# Patient Record
Sex: Male | Born: 1960 | Race: White | Hispanic: No | Marital: Married | Smoking: Never smoker
Health system: Southern US, Community
[De-identification: ages and names within clinical notes are randomized; demographics above are authoritative.]

## PROBLEM LIST (undated history)

## (undated) DIAGNOSIS — E119 Type 2 diabetes mellitus without complications: Secondary | ICD-10-CM

## (undated) DIAGNOSIS — E7800 Pure hypercholesterolemia, unspecified: Secondary | ICD-10-CM

## (undated) HISTORY — PX: APPENDECTOMY: SHX54

## (undated) HISTORY — PX: HERNIA REPAIR: SHX51

---

## 2005-11-29 ENCOUNTER — Observation Stay (HOSPITAL_COMMUNITY): Admission: EM | Admit: 2005-11-29 | Discharge: 2005-11-29 | Payer: Self-pay | Admitting: Emergency Medicine

## 2006-01-07 ENCOUNTER — Ambulatory Visit: Payer: Self-pay | Admitting: Family Medicine

## 2006-01-24 ENCOUNTER — Ambulatory Visit: Payer: Self-pay | Admitting: Family Medicine

## 2006-03-08 ENCOUNTER — Ambulatory Visit: Payer: Self-pay | Admitting: Family Medicine

## 2015-11-23 ENCOUNTER — Encounter (HOSPITAL_COMMUNITY): Payer: Self-pay | Admitting: Emergency Medicine

## 2015-11-23 ENCOUNTER — Emergency Department (HOSPITAL_COMMUNITY)
Admission: EM | Admit: 2015-11-23 | Discharge: 2015-11-23 | Disposition: A | Discharge status: Home / Self Care | Payer: BLUE CROSS/BLUE SHIELD | Attending: Emergency Medicine | Admitting: Emergency Medicine

## 2015-11-23 ENCOUNTER — Emergency Department (HOSPITAL_COMMUNITY): Payer: BLUE CROSS/BLUE SHIELD

## 2015-11-23 DIAGNOSIS — Z794 Long term (current) use of insulin: Secondary | ICD-10-CM | POA: Insufficient documentation

## 2015-11-23 DIAGNOSIS — R072 Precordial pain: Secondary | ICD-10-CM | POA: Insufficient documentation

## 2015-11-23 DIAGNOSIS — E119 Type 2 diabetes mellitus without complications: Secondary | ICD-10-CM | POA: Diagnosis not present

## 2015-11-23 DIAGNOSIS — Z79899 Other long term (current) drug therapy: Secondary | ICD-10-CM | POA: Insufficient documentation

## 2015-11-23 DIAGNOSIS — R079 Chest pain, unspecified: Secondary | ICD-10-CM

## 2015-11-23 HISTORY — DX: Type 2 diabetes mellitus without complications: E11.9

## 2015-11-23 HISTORY — DX: Pure hypercholesterolemia, unspecified: E78.00

## 2015-11-23 LAB — CBC WITH DIFFERENTIAL/PLATELET
BASOS PCT: 0 %
Basophils Absolute: 0 10*3/uL (ref 0.0–0.1)
Eosinophils Absolute: 0.1 10*3/uL (ref 0.0–0.7)
Eosinophils Relative: 2 %
HEMATOCRIT: 41.7 % (ref 39.0–52.0)
Hemoglobin: 14.6 g/dL (ref 13.0–17.0)
Lymphocytes Relative: 22 %
Lymphs Abs: 1.5 10*3/uL (ref 0.7–4.0)
MCH: 32.4 pg (ref 26.0–34.0)
MCHC: 35 g/dL (ref 30.0–36.0)
MCV: 92.5 fL (ref 78.0–100.0)
MONO ABS: 0.6 10*3/uL (ref 0.1–1.0)
MONOS PCT: 8 %
NEUTROS ABS: 4.5 10*3/uL (ref 1.7–7.7)
Neutrophils Relative %: 68 %
Platelets: 200 10*3/uL (ref 150–400)
RBC: 4.51 MIL/uL (ref 4.22–5.81)
RDW: 13 % (ref 11.5–15.5)
WBC: 6.7 10*3/uL (ref 4.0–10.5)

## 2015-11-23 LAB — I-STAT CHEM 8, ED
BUN: 9 mg/dL (ref 6–20)
CALCIUM ION: 1.19 mmol/L (ref 1.12–1.23)
CREATININE: 0.6 mg/dL — AB (ref 0.61–1.24)
Chloride: 101 mmol/L (ref 101–111)
GLUCOSE: 221 mg/dL — AB (ref 65–99)
HEMATOCRIT: 42 % (ref 39.0–52.0)
HEMOGLOBIN: 14.3 g/dL (ref 13.0–17.0)
Potassium: 4 mmol/L (ref 3.5–5.1)
Sodium: 138 mmol/L (ref 135–145)
TCO2: 26 mmol/L (ref 0–100)

## 2015-11-23 LAB — I-STAT TROPONIN, ED
TROPONIN I, POC: 0 ng/mL (ref 0.00–0.08)
Troponin i, poc: 0.01 ng/mL (ref 0.00–0.08)

## 2015-11-23 MED ORDER — LORAZEPAM 1 MG PO TABS
1.0000 mg | ORAL_TABLET | Freq: Once | ORAL | Status: AC
  Administered 2015-11-23: 1 mg via ORAL
  Filled 2015-11-23: qty 1

## 2015-11-23 MED ORDER — SODIUM CHLORIDE 0.9 % IV BOLUS (SEPSIS)
1000.0000 mL | Freq: Once | INTRAVENOUS | Status: AC
  Administered 2015-11-23: 1000 mL via INTRAVENOUS

## 2015-11-23 MED ORDER — ASPIRIN 81 MG PO CHEW
324.0000 mg | CHEWABLE_TABLET | Freq: Once | ORAL | Status: AC
  Administered 2015-11-23: 324 mg via ORAL
  Filled 2015-11-23: qty 4

## 2015-11-23 MED ORDER — GLUCOSE BLOOD VI STRP: ORAL_STRIP | Status: AC

## 2015-11-23 NOTE — ED Notes (Signed)
MD wants patient to receive second liter prior to DC.

## 2015-11-23 NOTE — Discharge Instructions (Signed)

## 2015-11-23 NOTE — ED Notes (Signed)
1 liter normal saline infused per pt request, after infusion, pt states he feels better.

## 2015-11-23 NOTE — ED Notes (Signed)
Pt to ER via private vehicle with complaint of sudden onset central chest pressure that woke patient this morning from sleep. Denies radiation. Reports shortness of breath, diaphoresis, and nausea with 1 episode of vomiting 30 cc. Pt reports hx of panic attacks. VSS at this time. A/o x4. Pt hx of DM type II and high cholesterol.

## 2015-11-23 NOTE — ED Notes (Signed)
Brought patient back via wheelchair; patient undressed, in gown, on monitor, continuous pulse oximetry and blood pressure cuff 

## 2015-11-23 NOTE — ED Notes (Signed)
Pt also reports umbilical pain that began last night, endorses nausea, has had loose bowel movements last night.

## 2015-11-23 NOTE — ED Provider Notes (Signed)
CSN: [id number]     Arrival date & time 11/23/15  9629 History   First MD Initiated Contact with Patient 11/23/15 [phone]     Chief Complaint  Patient presents with  . Chest Pain     (Consider location/radiation/quality/duration/timing/severity/associated sxs/prior Treatment) HPI   Brian Meyers is a 55 y.o. male who is visiting, [place], and states that he was awakened by "crushing, substernal chest pain associated with diaphoresis", and had a single episode of vomiting. He also states that he feels short of breath. The pain started about one hour ago. The pain was 9/10 initially, it is currently 5/10 on evaluation. He has had previous similar pain. He saw his PCP a couple of months ago and talked to her about some symptoms of shortness of breath, and fatigue. Dates that she wanted to evaluate him for heart problems. He had cardiac stress test 5 years ago, which was stated to be normal. He has diabetes and was recently prescribed metformin but it caused muscle aches so he stopped using it. He continues to take insulin twice a day. His sugars run "about 150". He denies fever, chills, cough, focal weakness or paresthesia, at this time. He is taking his usual medications. He does not take aspirin or nitroglycerin. There are no other known modifying factors.   Past Medical History  Diagnosis Date  . Diabetes mellitus without complication (HCC)   . Hypercholesteremia    Past Surgical History  Procedure Laterality Date  . Hernia repair    . Appendectomy     History reviewed. No pertinent family history. Social History  Substance Use Topics  . Smoking status: Never Smoker   . Smokeless tobacco: None  . Alcohol Use: Yes     Comment: occasionally    Review of Systems  All other systems reviewed and are negative.     Allergies  Haldol and Seroquel  Home Medications   Prior to Admission medications   Medication Sig Start Date End Date Taking? Authorizing Provider  atorvastatin  (LIPITOR) 40 MG tablet Take 40 mg by mouth daily.   Yes Historical Provider, MD  buPROPion (WELLBUTRIN SR) 100 MG 12 hr tablet Take 100 mg by mouth 2 (two) times daily.   Yes Historical Provider, MD  diazepam (VALIUM) 10 MG tablet Take 10 mg by mouth 3 (three) times daily as needed for anxiety.   Yes Historical Provider, MD  gabapentin (NEURONTIN) 600 MG tablet Take 600-1,200 mg by mouth 3 (three) times daily. 600 mg in the morning, 1200 mg in the afternoon and evening   Yes Historical Provider, MD  insulin NPH-regular Human (NOVOLIN 70/30) (70-30) 100 UNIT/ML injection Inject 20 Units into the skin 2 (two) times daily with a meal.   Yes Historical Provider, MD  LITHIUM CARBONATE ER PO Take 300-600 mg by mouth 2 (two) times daily. 300 mg in the morning, 600 mg at bedtime   Yes Historical Provider, MD  LORazepam (ATIVAN) 1 MG tablet Take 0.5-1 mg by mouth 2 (two) times daily.   Yes Historical Provider, MD  omeprazole (PRILOSEC) 20 MG capsule Take 20 mg by mouth daily.   Yes Historical Provider, MD   BP 117/73 mmHg  Pulse 78  Temp(Src) 97.9 F (36.6 C) (Oral)  Resp 20  SpO2 99% Physical Exam  Constitutional: He is oriented to person, place, and time. He appears well-developed and well-nourished. No distress.  HENT:  Head: Normocephalic and atraumatic.  Right Ear: External ear normal.  Left Ear: External ear normal.  Eyes: Conjunctivae and EOM are normal. Pupils are equal, round, and reactive to light.  Neck: Normal range of motion and phonation normal. Neck supple.  Cardiovascular: Normal rate, regular rhythm and normal heart sounds.   Pulmonary/Chest: Effort normal and breath sounds normal. No respiratory distress. He exhibits no tenderness and no bony tenderness.  Abdominal: Soft. There is no tenderness.  Musculoskeletal: Normal range of motion.  Neurological: He is alert and oriented to person, place, and time. No cranial nerve deficit or sensory deficit. He exhibits normal muscle tone.  Coordination normal.  Skin: Skin is warm, dry and intact. No rash noted. No erythema.  Psychiatric: His behavior is normal. Judgment and thought content normal.  Anxious  Nursing note and vitals reviewed.   ED Course  Procedures (including critical care time)  Initial clinical evaluation- nonspecific chest pain, with stated symptom complex, concerning for angina. The patient is however low risk, and appears more anxious, then in actual pain. EKG on arrival is normal. Vital signs, with some tachypnea, and hypertension. Will evaluate with a period of observation and then reassess. Ativan ordered for anxiety.  Medications  sodium chloride 0.9 % bolus 1,000 mL (not administered)  aspirin chewable tablet 324 mg (324 mg Oral Given 11/23/15 0854)  LORazepam (ATIVAN) tablet 1 mg (1 mg Oral Given 11/23/15 0853)    Patient Vitals for the past 24 hrs:  BP Temp Temp src Pulse Resp SpO2  11/23/15 1056 117/73 mmHg - - 78 20 99 %  11/23/15 0856 141/90 mmHg - - 80 19 98 %  11/23/15 0738 146/94 mmHg 97.9 F (36.6 C) Oral 81 (!) 28 100 %    1:57 PM Reevaluation with update and discussion. After initial assessment and treatment, an updated evaluation reveals That his chest pain has resolved. He would like another bag of fluids. He states that he has had some abdominal pain today in the periumbilical region, as well as having some decreased appetite for a couple of days. , No further complaints. Delta troponin now available. It is unchanged. Brian Meyers L        Labs Review Labs Reviewed  I-STAT CHEM 8, ED - Abnormal; Notable for the following:    Creatinine, Ser 0.60 (*)    Glucose, Bld 221 (*)    All other components within normal limits  CBC WITH DIFFERENTIAL/PLATELET  Brian Meyers, ED  Brian Meyers, ED    Imaging Review Dg Chest Port 1 View  11/23/2015  CLINICAL DATA:  55 year old male with acute onset chest pain and pressure which woke an from sleep. Shortness of breath,  diaphoresis and nausea. Initial encounter. EXAM: PORTABLE CHEST 1 VIEW COMPARISON:  11/28/2005. FINDINGS: Portable AP semi upright view at 0844 hours. Lung volumes remain normal. Mediastinal contours remain normal. Visualized tracheal air column is within normal limits. Allowing for portable technique, the lungs are clear. No pneumothorax or pleural effusion. IMPRESSION: Stable and negative, no acute cardiopulmonary abnormality. Electronically Signed   By: Odessa Fleming M.D.   On: 11/23/2015 09:07   I have personally reviewed and evaluated these images and lab results as part of my medical decision-making.   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 Doubt ACS, PE, pneumonia, or metabolic instability.   Nursing Notes Reviewed/ Care Coordinated Applicable Imaging Reviewed Interpretation of Laboratory Data incorporated into ED treatment  The patient appears reasonably screened and/or stabilized for discharge and I doubt any other medical condition or other [facility] requiring further screening, evaluation, or treatment in the ED at this time prior to discharge.  Plan: Home Medications- usual; Home Treatments- rest; return here if the recommended treatment, does not improve the symptoms; Recommended follow up- PCP prn   Mancel BaleElliott Shirleymae Hauth, MD 11/23/15 1501

## 2017-01-16 DEATH — deceased

## 2018-01-18 IMAGING — DX DG CHEST 1V PORT
1 series · 1 of 1 positions shown · non-contrast
Comparison: 11/28/2005.

CLINICAL DATA: 54-year-old male with acute onset chest pain and
pressure which woke an from sleep. Shortness of breath, diaphoresis
and nausea. Initial encounter.

EXAM:
PORTABLE CHEST 1 VIEW

[chest ap]
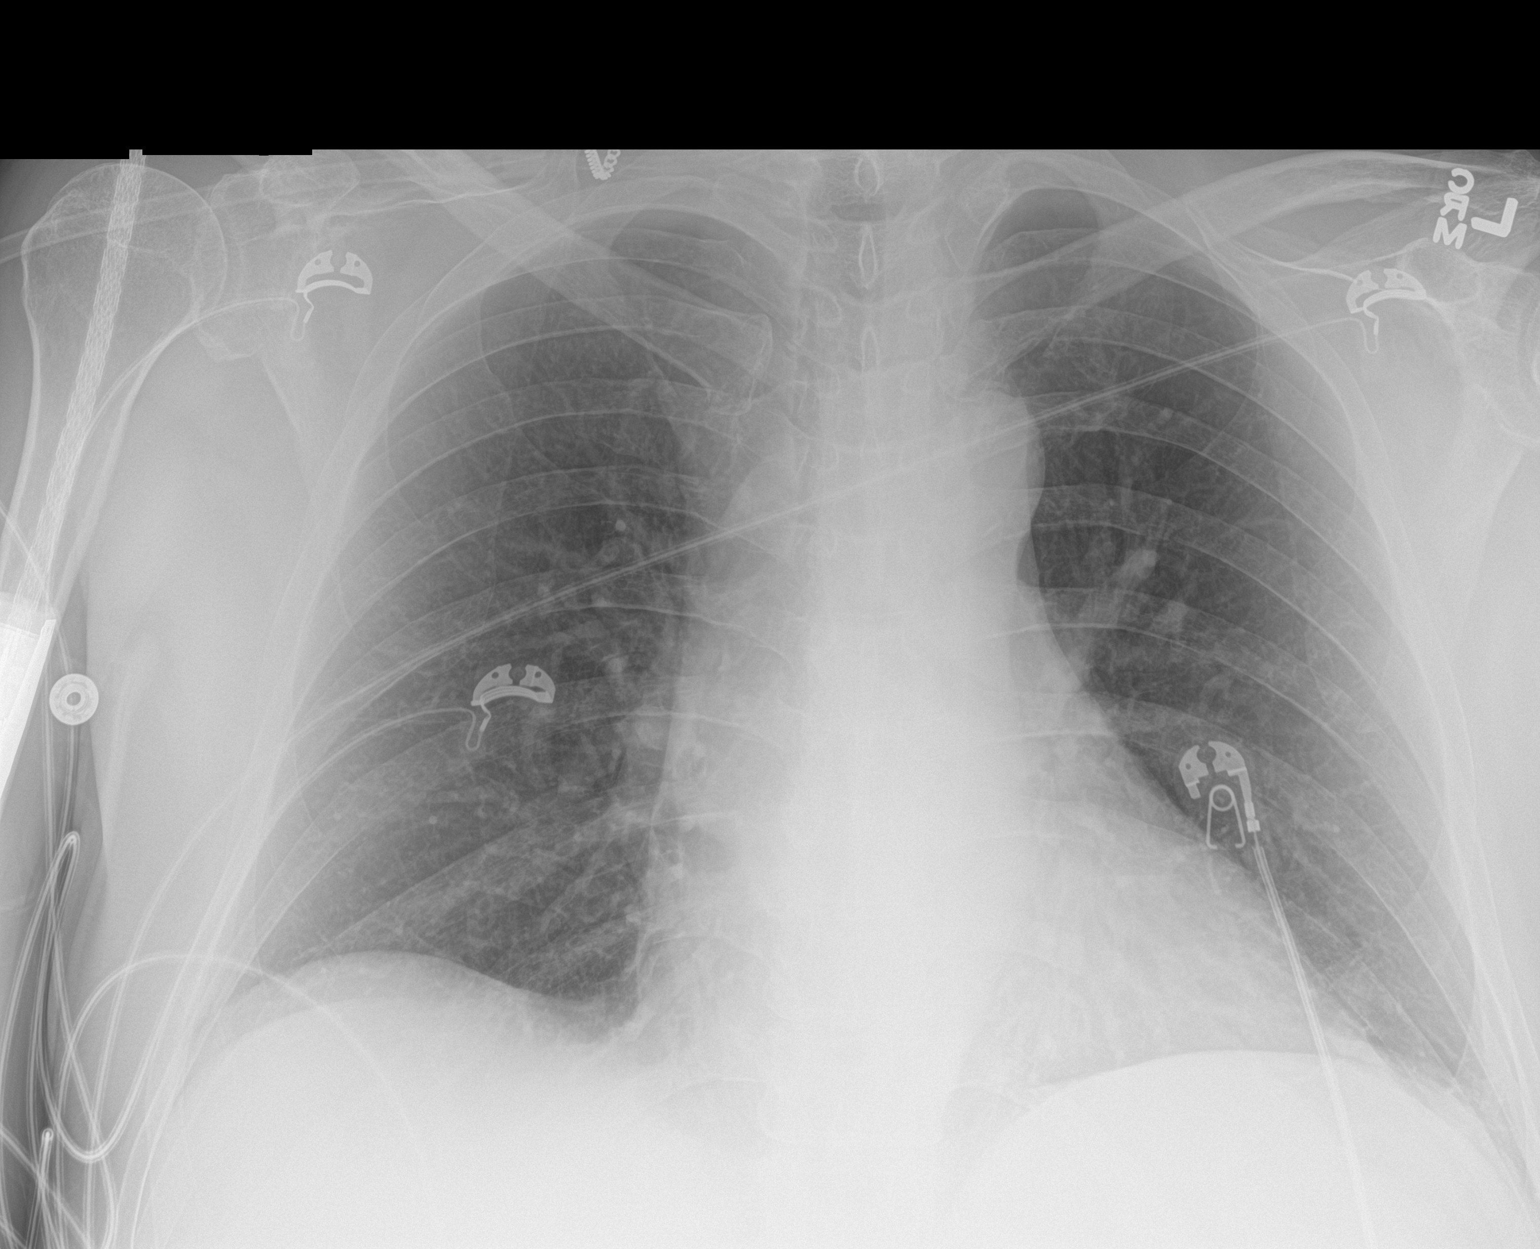

[1 of 1 positions shown; findings below may reference images not displayed]

FINDINGS: Portable AP semi upright view at 0955 hours. Lung volumes remain
normal. Mediastinal contours remain normal. Visualized tracheal air
column is within normal limits. Allowing for portable technique, the
lungs are clear. No pneumothorax or pleural effusion.
IMPRESSION: Stable and negative, no acute cardiopulmonary abnormality.
